# Patient Record
Sex: Male | Born: 1965 | Race: Black or African American | Hispanic: No | Marital: Married | Smoking: Never smoker
Health system: Southern US, Community
[De-identification: ages and names within clinical notes are randomized; demographics above are authoritative.]

## PROBLEM LIST (undated history)

## (undated) DIAGNOSIS — M7552 Bursitis of left shoulder: Secondary | ICD-10-CM

## (undated) DIAGNOSIS — M19019 Primary osteoarthritis, unspecified shoulder: Secondary | ICD-10-CM

## (undated) DIAGNOSIS — Z98811 Dental restoration status: Secondary | ICD-10-CM

## (undated) HISTORY — PX: SHOULDER ARTHROSCOPY W/ ROTATOR CUFF REPAIR: SHX2400

---

## 2015-07-11 ENCOUNTER — Emergency Department
Admission: EM | Admit: 2015-07-11 | Discharge: 2015-07-11 | Disposition: A | Discharge status: Home / Self Care | Payer: BC Managed Care – PPO | Attending: Emergency Medicine | Admitting: Emergency Medicine

## 2015-07-11 ENCOUNTER — Encounter: Payer: Self-pay | Admitting: Emergency Medicine

## 2015-07-11 ENCOUNTER — Emergency Department: Payer: BC Managed Care – PPO

## 2015-07-11 DIAGNOSIS — M545 Low back pain, unspecified: Secondary | ICD-10-CM

## 2015-07-11 MED ORDER — OXYCODONE-ACETAMINOPHEN 5-325 MG PO TABS
1.0000 | ORAL_TABLET | ORAL | Status: DC | PRN
Start: 2015-07-11 — End: 2017-01-27

## 2015-07-11 MED ORDER — KETOROLAC TROMETHAMINE 60 MG/2ML IM SOLN
60.0000 mg | Freq: Once | INTRAMUSCULAR | Status: AC
  Administered 2015-07-11: 60 mg via INTRAMUSCULAR

## 2015-07-11 MED ORDER — LIDOCAINE 5 % EX PTCH
2.0000 | MEDICATED_PATCH | CUTANEOUS | Status: DC
  Administered 2015-07-11: 2 via TRANSDERMAL
  Filled 2015-07-11: qty 2

## 2015-07-11 MED ORDER — KETOROLAC TROMETHAMINE 60 MG/2ML IM SOLN
INTRAMUSCULAR | Status: AC
  Administered 2015-07-11: 60 mg via INTRAMUSCULAR
  Filled 2015-07-11: qty 2

## 2015-07-11 NOTE — ED Notes (Addendum)
Pt reports having back pain since Friday with increased pain since Sunday - The pain is located across the lower part of his back - Pt denies difficulty/pain with urination - Pt denies urinary frequency - Pt denies having any back issues and has never has this type of pain before - Pt hurt back when moving some boxes on Friday and he "slipped"

## 2015-07-11 NOTE — ED Notes (Signed)
Pt ambulatory to triage with c/o sharp lower back pain (7/10),  Worse with sitting or lying down.  Pt reports injury as slipping while moving boxes at home on Saturday, reports taking 2 x 200mg  IBU last night at 2100.  Pt reports pain radiating down both legs.    Pt denies numbness, prior medical hx and no regular meds.

## 2015-07-11 NOTE — ED Notes (Signed)
Called pharmacy to send lidoderm patch

## 2015-07-11 NOTE — ED Provider Notes (Signed)
[facility] Emergency Department Provider Note  ____________________________________________  Time seen: 5:30 AM  I have reviewed the triage vital signs and the nursing notes.   HISTORY  Chief Complaint Back Pain      HPI Justin Ferguson is a 50 y.o. male presents with lower back pain 5 days after accidental slip and fall striking his back while moving. Patient states current pain score 7 out of 10 worse with movement. Patient denies any urinary or bowel changes. Patient denies any lower extremity weakness or numbness. Patient denies any fever     Past medical history None There are no active problems to display for this patient.   Past surgical history None No current outpatient prescriptions on file.  Allergies Bee venom  History reviewed. No pertinent family history.  Social History Social History  Substance Use Topics  . Smoking status: Never Smoker   . Smokeless tobacco: None  . Alcohol Use: Yes     Comment: 3-4 times weekly    Review of Systems  Constitutional: Negative for fever. Eyes: Negative for visual changes. ENT: Negative for sore throat. Cardiovascular: Negative for chest pain. Respiratory: Negative for shortness of breath. Gastrointestinal: Negative for abdominal pain, vomiting and diarrhea. Genitourinary: Negative for dysuria. Musculoskeletal:Positive for back pain. Skin: Negative for rash. Neurological: Negative for headaches, focal weakness or numbness.   10-point ROS otherwise negative.  ____________________________________________   PHYSICAL EXAM:  VITAL SIGNS: ED Triage Vitals  Enc Vitals Group     BP 07/11/15 0459 162/102 mmHg     Pulse Rate 07/11/15 0459 87     Resp 07/11/15 0459 18     Temp 07/11/15 0459 98.2 F (36.8 C)     Temp Source 07/11/15 0459 Oral     SpO2 07/11/15 0459 97 %     Weight 07/11/15 0459 220 lb (99.791 kg)     Height 07/11/15 0459 5\' 11"  (1.803 m)     Head Cir --    Peak Flow --      Pain Score 07/11/15 0500 7     Pain Loc --      Pain Edu? --      Excl. in GC? --      Constitutional: Alert and oriented. Apparent discomfort Gastrointestinal: Soft and nontender. No distention. There is no CVA tenderness. Genitourinary: deferred Musculoskeletal:Diffuse lumbar spine pain with palpation L1-L5. As well as paraspinal muscle tenderness with palpation.  Neurologic:  Normal speech and language. No gross focal neurologic deficits are appreciated. Speech is normal.  Skin:  Skin is warm, dry and intact. No rash noted. Psychiatric: Mood and affect are normal. Speech and behavior are normal. Patient exhibits appropriate insight and judgment.    RADIOLOGY      DG Lumbar Spine Complete (Final result) Result time: 07/11/15 05:59:39   Final result by Rad Results In Interface (07/11/15 05:59:39)   Narrative:   CLINICAL DATA: Acute onset of lower back pain. Slipped while moving boxes. Initial encounter.  EXAM: LUMBAR SPINE - COMPLETE 4+ VIEW  COMPARISON: None.  FINDINGS: There is no evidence of fracture or subluxation. Vertebral bodies demonstrate normal height and alignment. Anterior and lateral osteophytes are seen along the lumbar spine. Intervertebral disc spaces are preserved.  The visualized bowel gas pattern is unremarkable in appearance; air and stool are noted within the colon. The sacroiliac joints are within normal limits.  IMPRESSION: No evidence of fracture or subluxation along the lumbar spine.   Electronically Signed By: Beryle BeamsJeffery Chang M.D.  On: 07/11/2015 05:59      Procedures    INITIAL IMPRESSION / ASSESSMENT AND PLAN / ED COURSE  Pertinent labs & imaging results that were available during my care of the patient were reviewed by me and considered in my medical decision making (see chart for details).  Patient advised to follow-up with orthopedic surgeon if pain is not progressively get better even possibly for  possible herniated disc. Lidoderm patch applied Toradol 60 mg IM given  ____________________________________________   FINAL CLINICAL IMPRESSION(S) / ED DIAGNOSES  Final diagnoses:  Bilateral low back pain without sciatica      Darci Currentandolph N Brown, MD 07/11/15 [phone]

## 2015-07-11 NOTE — Discharge Instructions (Signed)

## 2016-03-05 ENCOUNTER — Emergency Department
Admission: EM | Admit: 2016-03-05 | Discharge: 2016-03-05 | Disposition: A | Discharge status: Home / Self Care | Payer: Self-pay | Attending: Emergency Medicine | Admitting: Emergency Medicine

## 2016-03-05 ENCOUNTER — Emergency Department: Payer: Self-pay

## 2016-03-05 DIAGNOSIS — Y9389 Activity, other specified: Secondary | ICD-10-CM | POA: Insufficient documentation

## 2016-03-05 DIAGNOSIS — Z79899 Other long term (current) drug therapy: Secondary | ICD-10-CM | POA: Insufficient documentation

## 2016-03-05 DIAGNOSIS — Y929 Unspecified place or not applicable: Secondary | ICD-10-CM | POA: Insufficient documentation

## 2016-03-05 DIAGNOSIS — Y999 Unspecified external cause status: Secondary | ICD-10-CM | POA: Insufficient documentation

## 2016-03-05 DIAGNOSIS — X501XXA Overexertion from prolonged static or awkward postures, initial encounter: Secondary | ICD-10-CM | POA: Insufficient documentation

## 2016-03-05 DIAGNOSIS — S93492A Sprain of other ligament of left ankle, initial encounter: Secondary | ICD-10-CM | POA: Insufficient documentation

## 2016-03-05 MED ORDER — TRAMADOL HCL 50 MG PO TABS: 50.0000 mg | ORAL_TABLET | Freq: Four times a day (QID) | ORAL | 0 refills | Status: DC | PRN

## 2016-03-05 MED ORDER — MELOXICAM 15 MG PO TABS: 15.0000 mg | ORAL_TABLET | Freq: Every day | ORAL | 0 refills | Status: AC

## 2016-03-05 NOTE — Discharge Instructions (Signed)
Follow up with the podiatrist if not improving over the week. Rest, ice, and elevate. Return to the ER for symptoms that change or worsen if you are unable to schedule an appointment with podiatry or the primary care provider.

## 2016-03-05 NOTE — ED Provider Notes (Signed)
[facility] Emergency Department Provider Note ____________________________________________  Time seen: Approximately 9:46 AM  I have reviewed the triage vital signs and the nursing notes.   HISTORY  Chief Complaint Ankle Pain and Foot Pain    HPI Justin Ferguson is a 51 y.o. male who presents to the emergency department for evaluation of left ankle pain. He states he stepped off a curb and twisted his left ankle last night. He has taken ibuprofen without relief. His job involves waxing and stripping floors. He was unable to work today.  No past medical history on file.  There are no active problems to display for this patient.   No past surgical history on file.  Prior to Admission medications   Medication Sig Start Date End Date Taking? Authorizing Provider  meloxicam (MOBIC) 15 MG tablet Take 1 tablet (15 mg total) by mouth daily. 03/05/16   Chinita Pesterari B Macala Baldonado, FNP  oxyCODONE-acetaminophen (ROXICET) 5-325 MG tablet Take 1 tablet by mouth every 4 (four) hours as needed for severe pain. 07/11/15   Darci Currentandolph N Brown, MD  traMADol (ULTRAM) 50 MG tablet Take 1 tablet (50 mg total) by mouth every 6 (six) hours as needed. 03/05/16   Chinita Pesterari B Fredric Slabach, FNP    Allergies Bee venom  No family history on file.  Social History Social History  Substance Use Topics  . Smoking status: Never Smoker  . Smokeless tobacco: Never Used  . Alcohol use Yes     Comment: 3-4 times weekly    Review of Systems Constitutional: No recent illness. Cardiovascular: Denies chest pain or palpitations. Respiratory: Denies shortness of breath. Musculoskeletal: Pain in Left ankle. Skin: Negative for rash, wound, lesion. Neurological: Negative for focal weakness or numbness.  ____________________________________________   PHYSICAL EXAM:  VITAL SIGNS: ED Triage Vitals [03/05/16 0910]  Enc Vitals Group     BP (!) 174/105     Pulse Rate 100     Resp 18     Temp 98.8 F (37.1  C)     Temp Source Oral     SpO2 98 %     Weight 235 lb (106.6 kg)     Height 5\' 11"  (1.803 m)     Head Circumference      Peak Flow      Pain Score 8     Pain Loc      Pain Edu?      Excl. in GC?     Constitutional: Alert and oriented. Well appearing and in no acute distress. Eyes: Conjunctivae are normal. EOMI. Head: Atraumatic. Neck: No stridor.  Respiratory: Normal respiratory effort.   Musculoskeletal: ATFL pattern tenderness over the left ankle and foot without acute bony abnormality and mild swelling. Neurologic:  Normal speech and language. No gross focal neurologic deficits are appreciated. Speech is normal. No gait instability. Skin:  Skin is warm, dry and intact. Atraumatic. Psychiatric: Mood and affect are normal. Speech and behavior are normal.  ____________________________________________   LABS (all labs ordered are listed, but only abnormal results are displayed)  Labs Reviewed - No data to display ____________________________________________  RADIOLOGY  Left ankle negative for acute bony abnormality per radiology.  I, Kem Boroughsari Graves Nipp, personally viewed and evaluated these images (plain radiographs) as part of my medical decision making, as well as reviewing the written report by the radiologist. ____________________________________________   PROCEDURES  Procedure(s) performed: Velcro ankle stirrup splint applied by ER tech. Crutches given with crutch training.  ____________________________________________   INITIAL IMPRESSION /  ASSESSMENT AND PLAN / ED COURSE  51 year old male presenting to the emergency department for treatment of ankle sprain.  He was encouraged to rest, ice, and elevate his lower extremity over the next several days. He was instructed to wear the brace and use the crutches as long as he is unable to bear weight. If symptoms persist for longer than one week he is to call and schedule a follow-up appointment with podiatry. He was given  prescriptions for meloxicam and tramadol. He was advised to return to the emergency department for symptoms that change or worsen if he is unable see his primary care provider with or the podiatrist.  Pertinent labs & imaging results that were available during my care of the patient were reviewed by me and considered in my medical decision making (see chart for details).  _________________________________________   FINAL CLINICAL IMPRESSION(S) / ED DIAGNOSES  Final diagnoses:  Sprain of anterior talofibular ligament of left ankle, initial encounter    New Prescriptions   MELOXICAM (MOBIC) 15 MG TABLET    Take 1 tablet (15 mg total) by mouth daily.   TRAMADOL (ULTRAM) 50 MG TABLET    Take 1 tablet (50 mg total) by mouth every 6 (six) hours as needed.    If controlled substance prescribed during this visit, 12 month history viewed on the [facility] prior to issuing an initial prescription for Schedule II or III opiod.    Chinita Pester, FNP 03/05/16 1027    Emily Filbert, MD 03/05/16 [phone]

## 2016-03-05 NOTE — ED Triage Notes (Signed)
[facility] with reports of stepping off a curb yesterday and twisting his ankle  Pt reports 8/10 pain to left ankle/foot at this time

## 2016-03-05 NOTE — ED Notes (Signed)
See triage note  States he stepped down off curb wrong  Having pain with some swelling across foot/ankle  No deformity noted positive pulses

## 2017-01-06 DIAGNOSIS — M7552 Bursitis of left shoulder: Secondary | ICD-10-CM

## 2017-01-06 DIAGNOSIS — M19019 Primary osteoarthritis, unspecified shoulder: Secondary | ICD-10-CM

## 2017-01-06 HISTORY — DX: Primary osteoarthritis, unspecified shoulder: M19.019

## 2017-01-06 HISTORY — DX: Bursitis of left shoulder: M75.52

## 2017-01-21 ENCOUNTER — Other Ambulatory Visit: Payer: Self-pay | Admitting: Orthopedic Surgery

## 2017-01-27 ENCOUNTER — Encounter (HOSPITAL_BASED_OUTPATIENT_CLINIC_OR_DEPARTMENT_OTHER): Payer: Self-pay | Admitting: *Deleted

## 2017-01-27 ENCOUNTER — Other Ambulatory Visit: Payer: Self-pay

## 2017-01-31 NOTE — Anesthesia Preprocedure Evaluation (Signed)
Anesthesia Evaluation  Patient identified by MRN, date of birth, ID band Patient awake    Reviewed: Allergy & Precautions, NPO status , Patient's Chart, lab work & pertinent test results  Airway Mallampati: II  TM Distance: >3 FB Neck ROM: Full    Dental no notable dental hx.    Pulmonary neg pulmonary ROS,    Pulmonary exam normal breath sounds clear to auscultation       Cardiovascular negative cardio ROS Normal cardiovascular exam Rhythm:Regular Rate:Normal     Neuro/Psych negative neurological ROS  negative psych ROS   GI/Hepatic negative GI ROS, Neg liver ROS,   Endo/Other  negative endocrine ROS  Renal/GU negative Renal ROS  negative genitourinary   Musculoskeletal negative musculoskeletal ROS (+)   Abdominal   Peds negative pediatric ROS (+)  Hematology negative hematology ROS (+)   Anesthesia Other Findings   Reproductive/Obstetrics negative OB ROS                             Anesthesia Physical Anesthesia Plan  ASA: II  Anesthesia Plan: General   Post-op Pain Management: GA combined w/ Regional for post-op pain   Induction: Intravenous  PONV Risk Score and Plan: 2 and Ondansetron, Dexamethasone and Treatment may vary due to age or medical condition  Airway Management Planned: Oral ETT  Additional Equipment:   Intra-op Plan:   Post-operative Plan: Extubation in OR  Informed Consent: I have reviewed the patients History and Physical, chart, labs and discussed the procedure including the risks, benefits and alternatives for the proposed anesthesia with the patient or authorized representative who has indicated his/her understanding and acceptance.   Dental advisory given  Plan Discussed with: Anesthesiologist and CRNA  Anesthesia Plan Comments: (  )        Anesthesia Quick Evaluation

## 2017-02-02 ENCOUNTER — Ambulatory Visit (HOSPITAL_BASED_OUTPATIENT_CLINIC_OR_DEPARTMENT_OTHER): Payer: Worker's Compensation | Admitting: Anesthesiology

## 2017-02-02 ENCOUNTER — Encounter (HOSPITAL_BASED_OUTPATIENT_CLINIC_OR_DEPARTMENT_OTHER)
Admission: RE | Disposition: A | Discharge status: Home / Self Care | Payer: Self-pay | Source: Ambulatory Visit | Attending: Orthopedic Surgery

## 2017-02-02 ENCOUNTER — Other Ambulatory Visit: Payer: Self-pay

## 2017-02-02 ENCOUNTER — Ambulatory Visit (HOSPITAL_BASED_OUTPATIENT_CLINIC_OR_DEPARTMENT_OTHER)
Admission: RE | Admit: 2017-02-02 | Discharge: 2017-02-02 | Disposition: A | Discharge status: Home / Self Care | Payer: Worker's Compensation | Source: Ambulatory Visit | Attending: Orthopedic Surgery | Admitting: Orthopedic Surgery

## 2017-02-02 ENCOUNTER — Encounter (HOSPITAL_BASED_OUTPATIENT_CLINIC_OR_DEPARTMENT_OTHER): Payer: Self-pay

## 2017-02-02 DIAGNOSIS — M19012 Primary osteoarthritis, left shoulder: Secondary | ICD-10-CM | POA: Diagnosis not present

## 2017-02-02 DIAGNOSIS — M7552 Bursitis of left shoulder: Secondary | ICD-10-CM | POA: Diagnosis not present

## 2017-02-02 HISTORY — DX: Dental restoration status: Z98.811

## 2017-02-02 HISTORY — DX: Primary osteoarthritis, unspecified shoulder: M19.019

## 2017-02-02 HISTORY — DX: Bursitis of left shoulder: M75.52

## 2017-02-02 SURGERY — SHOULDER ARTHROSCOPY WITH SUBACROMIAL DECOMPRESSION AND DISTAL CLAVICLE EXCISION
Anesthesia: General | Site: Shoulder | Laterality: Left

## 2017-02-02 MED ORDER — MIDAZOLAM HCL 2 MG/2ML IJ SOLN
1.0000 mg | INTRAMUSCULAR | Status: DC | PRN
  Administered 2017-02-02: 2 mg via INTRAVENOUS

## 2017-02-02 MED ORDER — EPHEDRINE SULFATE-NACL 50-0.9 MG/10ML-% IV SOSY
PREFILLED_SYRINGE | INTRAVENOUS | Status: DC | PRN
  Administered 2017-02-02 (×5): 10 mg via INTRAVENOUS

## 2017-02-02 MED ORDER — ROCURONIUM BROMIDE 50 MG/5ML IV SOSY
PREFILLED_SYRINGE | INTRAVENOUS | Status: DC | PRN
  Administered 2017-02-02: 50 mg via INTRAVENOUS

## 2017-02-02 MED ORDER — CEFAZOLIN SODIUM-DEXTROSE 2-4 GM/100ML-% IV SOLN
2.0000 g | INTRAVENOUS | Status: AC
  Administered 2017-02-02: 2 g via INTRAVENOUS

## 2017-02-02 MED ORDER — PHENYLEPHRINE 40 MCG/ML (10ML) SYRINGE FOR IV PUSH (FOR BLOOD PRESSURE SUPPORT)
PREFILLED_SYRINGE | INTRAVENOUS | Status: DC | PRN
  Administered 2017-02-02 (×3): 80 ug via INTRAVENOUS
  Administered 2017-02-02: 160 ug via INTRAVENOUS
  Administered 2017-02-02 (×2): 80 ug via INTRAVENOUS

## 2017-02-02 MED ORDER — EPHEDRINE 5 MG/ML INJ
INTRAVENOUS | Status: AC
  Filled 2017-02-02: qty 10

## 2017-02-02 MED ORDER — ONDANSETRON HCL 4 MG/2ML IJ SOLN: 4.0000 mg | Freq: Once | INTRAMUSCULAR | Status: DC | PRN

## 2017-02-02 MED ORDER — MIDAZOLAM HCL 2 MG/2ML IJ SOLN
INTRAMUSCULAR | Status: AC
  Filled 2017-02-02: qty 2

## 2017-02-02 MED ORDER — ONDANSETRON HCL 4 MG/2ML IJ SOLN
INTRAMUSCULAR | Status: DC | PRN
  Administered 2017-02-02: 4 mg via INTRAVENOUS

## 2017-02-02 MED ORDER — ACETAMINOPHEN 160 MG/5ML PO SOLN: 325.0000 mg | ORAL | Status: DC | PRN

## 2017-02-02 MED ORDER — PROPOFOL 10 MG/ML IV BOLUS
INTRAVENOUS | Status: DC | PRN
  Administered 2017-02-02: 200 mg via INTRAVENOUS

## 2017-02-02 MED ORDER — DEXAMETHASONE SODIUM PHOSPHATE 10 MG/ML IJ SOLN
INTRAMUSCULAR | Status: AC
  Filled 2017-02-02: qty 1

## 2017-02-02 MED ORDER — ROCURONIUM BROMIDE 10 MG/ML (PF) SYRINGE
PREFILLED_SYRINGE | INTRAVENOUS | Status: AC
  Filled 2017-02-02: qty 5

## 2017-02-02 MED ORDER — BUPIVACAINE-EPINEPHRINE (PF) 0.5% -1:200000 IJ SOLN
INTRAMUSCULAR | Status: AC
  Filled 2017-02-02: qty 30

## 2017-02-02 MED ORDER — PHENYLEPHRINE 40 MCG/ML (10ML) SYRINGE FOR IV PUSH (FOR BLOOD PRESSURE SUPPORT)
PREFILLED_SYRINGE | INTRAVENOUS | Status: AC
  Filled 2017-02-02: qty 10

## 2017-02-02 MED ORDER — DEXAMETHASONE SODIUM PHOSPHATE 10 MG/ML IJ SOLN
INTRAMUSCULAR | Status: DC | PRN
  Administered 2017-02-02: 10 mg via INTRAVENOUS

## 2017-02-02 MED ORDER — FENTANYL CITRATE (PF) 100 MCG/2ML IJ SOLN
INTRAMUSCULAR | Status: AC
  Filled 2017-02-02: qty 2

## 2017-02-02 MED ORDER — ACETAMINOPHEN 325 MG PO TABS: 325.0000 mg | ORAL_TABLET | ORAL | Status: DC | PRN

## 2017-02-02 MED ORDER — SUGAMMADEX SODIUM 200 MG/2ML IV SOLN
INTRAVENOUS | Status: AC
  Filled 2017-02-02: qty 2

## 2017-02-02 MED ORDER — BUPIVACAINE HCL (PF) 0.75 % IJ SOLN
INTRAMUSCULAR | Status: DC | PRN
  Administered 2017-02-02: 30 mL

## 2017-02-02 MED ORDER — ACETAMINOPHEN 325 MG PO TABS: 650.0000 mg | ORAL_TABLET | Freq: Four times a day (QID) | ORAL | Status: AC

## 2017-02-02 MED ORDER — OXYCODONE HCL 5 MG/5ML PO SOLN: 5.0000 mg | Freq: Once | ORAL | Status: DC | PRN

## 2017-02-02 MED ORDER — KETOROLAC TROMETHAMINE 30 MG/ML IJ SOLN: 30.0000 mg | Freq: Once | INTRAMUSCULAR | Status: DC | PRN

## 2017-02-02 MED ORDER — PROPOFOL 10 MG/ML IV BOLUS
INTRAVENOUS | Status: AC
Start: 2017-02-02 — End: ?
  Filled 2017-02-02: qty 40

## 2017-02-02 MED ORDER — FENTANYL CITRATE (PF) 100 MCG/2ML IJ SOLN: 25.0000 ug | INTRAMUSCULAR | Status: DC | PRN

## 2017-02-02 MED ORDER — FENTANYL CITRATE (PF) 100 MCG/2ML IJ SOLN
50.0000 ug | INTRAMUSCULAR | Status: DC | PRN
  Administered 2017-02-02: 100 ug via INTRAVENOUS

## 2017-02-02 MED ORDER — SCOPOLAMINE 1 MG/3DAYS TD PT72: 1.0000 | MEDICATED_PATCH | Freq: Once | TRANSDERMAL | Status: DC | PRN

## 2017-02-02 MED ORDER — CEFAZOLIN SODIUM-DEXTROSE 2-4 GM/100ML-% IV SOLN
INTRAVENOUS | Status: AC
  Filled 2017-02-02: qty 100

## 2017-02-02 MED ORDER — PHENYLEPHRINE 40 MCG/ML (10ML) SYRINGE FOR IV PUSH (FOR BLOOD PRESSURE SUPPORT)
PREFILLED_SYRINGE | INTRAVENOUS | Status: AC
Start: 2017-02-02 — End: ?
  Filled 2017-02-02: qty 10

## 2017-02-02 MED ORDER — SUGAMMADEX SODIUM 200 MG/2ML IV SOLN
INTRAVENOUS | Status: DC | PRN
  Administered 2017-02-02: 200 mg via INTRAVENOUS

## 2017-02-02 MED ORDER — MEPERIDINE HCL 25 MG/ML IJ SOLN: 6.2500 mg | INTRAMUSCULAR | Status: DC | PRN

## 2017-02-02 MED ORDER — BUPIVACAINE HCL (PF) 0.25 % IJ SOLN
INTRAMUSCULAR | Status: AC
  Filled 2017-02-02: qty 30

## 2017-02-02 MED ORDER — LIDOCAINE 2% (20 MG/ML) 5 ML SYRINGE
INTRAMUSCULAR | Status: AC
  Filled 2017-02-02: qty 5

## 2017-02-02 MED ORDER — OXYCODONE HCL 5 MG PO TABS: 5.0000 mg | ORAL_TABLET | Freq: Four times a day (QID) | ORAL | 0 refills | Status: DC | PRN

## 2017-02-02 MED ORDER — LACTATED RINGERS IV SOLN
INTRAVENOUS | Status: DC
  Administered 2017-02-02 (×2): via INTRAVENOUS

## 2017-02-02 MED ORDER — ONDANSETRON HCL 4 MG/2ML IJ SOLN
INTRAMUSCULAR | Status: AC
  Filled 2017-02-02: qty 2

## 2017-02-02 MED ORDER — LACTATED RINGERS IV SOLN
INTRAVENOUS | Status: DC
  Administered 2017-02-02: 08:00:00 via INTRAVENOUS

## 2017-02-02 MED ORDER — OXYCODONE HCL 5 MG PO TABS: 5.0000 mg | ORAL_TABLET | Freq: Once | ORAL | Status: DC | PRN

## 2017-02-02 MED ORDER — LIDOCAINE 2% (20 MG/ML) 5 ML SYRINGE
INTRAMUSCULAR | Status: DC | PRN
  Administered 2017-02-02: 80 mg via INTRAVENOUS

## 2017-02-02 MED ORDER — FENTANYL CITRATE (PF) 100 MCG/2ML IJ SOLN
INTRAMUSCULAR | Status: DC | PRN
  Administered 2017-02-02: 100 ug via INTRAVENOUS

## 2017-02-02 SURGICAL SUPPLY — 80 items
BENZOIN TINCTURE PRP APPL 2/3 (GAUZE/BANDAGES/DRESSINGS) IMPLANT
BLADE AVERAGE 25MMX9MM (BLADE)
BLADE AVERAGE 25X9 (BLADE) IMPLANT
BLADE CUTTER GATOR 3.5 (BLADE) IMPLANT
BLADE GREAT WHITE 4.2 (BLADE) ×2 IMPLANT
BLADE GREAT WHITE 4.2MM (BLADE) ×1
BLADE SURG 15 STRL LF DISP TIS (BLADE) IMPLANT
BLADE SURG 15 STRL SS (BLADE)
BNDG GAUZE ELAST 4 BULKY (GAUZE/BANDAGES/DRESSINGS) IMPLANT
BUR OVAL 6.0 (BURR) ×3 IMPLANT
CANNULA 5.75X71 LONG (CANNULA) IMPLANT
CANNULA TWIST IN 8.25X7CM (CANNULA) ×3 IMPLANT
CLEANER CAUTERY TIP 5X5 PAD (MISCELLANEOUS) IMPLANT
CLOSURE WOUND 1/2 X4 (GAUZE/BANDAGES/DRESSINGS)
DECANTER SPIKE VIAL GLASS SM (MISCELLANEOUS) IMPLANT
DERMABOND ADVANCED (GAUZE/BANDAGES/DRESSINGS)
DERMABOND ADVANCED .7 DNX12 (GAUZE/BANDAGES/DRESSINGS) IMPLANT
DRAPE IMP U-DRAPE 54X76 (DRAPES) ×3 IMPLANT
DRAPE STERI 35X30 U-POUCH (DRAPES) ×3 IMPLANT
DRAPE SURG 17X23 STRL (DRAPES) ×3 IMPLANT
DRAPE U-SHAPE 47X51 STRL (DRAPES) ×3 IMPLANT
DRAPE U-SHAPE 76X120 STRL (DRAPES) ×6 IMPLANT
DRSG EMULSION OIL 3X3 NADH (GAUZE/BANDAGES/DRESSINGS) IMPLANT
DURAPREP 26ML APPLICATOR (WOUND CARE) ×3 IMPLANT
ELECT REM PT RETURN 9FT ADLT (ELECTROSURGICAL) ×3
ELECTRODE REM PT RTRN 9FT ADLT (ELECTROSURGICAL) ×1 IMPLANT
GAUZE SPONGE 4X4 12PLY STRL LF (GAUZE/BANDAGES/DRESSINGS) ×3 IMPLANT
GLOVE BIO SURGEON STRL SZ7.5 (GLOVE) ×3 IMPLANT
GLOVE BIOGEL PI IND STRL 7.0 (GLOVE) ×2 IMPLANT
GLOVE BIOGEL PI IND STRL 8 (GLOVE) ×1 IMPLANT
GLOVE BIOGEL PI INDICATOR 7.0 (GLOVE) ×4
GLOVE BIOGEL PI INDICATOR 8 (GLOVE) ×2
GLOVE ECLIPSE 6.5 STRL STRAW (GLOVE) ×6 IMPLANT
GOWN STRL REUS W/ TWL LRG LVL3 (GOWN DISPOSABLE) ×2 IMPLANT
GOWN STRL REUS W/TWL LRG LVL3 (GOWN DISPOSABLE) ×4
GOWN STRL REUS W/TWL XL LVL3 (GOWN DISPOSABLE) ×3 IMPLANT
IV NS IRRIG 3000ML ARTHROMATIC (IV SOLUTION) ×6 IMPLANT
MANIFOLD NEPTUNE II (INSTRUMENTS) ×3 IMPLANT
NDL SUT 6 .5 CRC .975X.05 MAYO (NEEDLE) IMPLANT
NEEDLE MAYO TAPER (NEEDLE)
NEEDLE SCORPION MULTI FIRE (NEEDLE) IMPLANT
PACK ARTHROSCOPY DSU (CUSTOM PROCEDURE TRAY) ×3 IMPLANT
PACK BASIN DAY SURGERY FS (CUSTOM PROCEDURE TRAY) ×3 IMPLANT
PAD CLEANER CAUTERY TIP 5X5 (MISCELLANEOUS)
PAD ORTHO SHOULDER 7X19 LRG (SOFTGOODS) IMPLANT
PENCIL BUTTON HOLSTER BLD 10FT (ELECTRODE) IMPLANT
PROBE BIPOLAR ATHRO 135MM 90D (MISCELLANEOUS) ×3 IMPLANT
RESTRAINT HEAD UNIVERSAL NS (MISCELLANEOUS) ×3 IMPLANT
RETRIEVER SUT HEWSON (MISCELLANEOUS) IMPLANT
SLING ARM FOAM STRAP LRG (SOFTGOODS) IMPLANT
SLING ARM MED ADULT FOAM STRAP (SOFTGOODS) IMPLANT
SLING ARM SM FOAM STRAP (SOFTGOODS) IMPLANT
SLING ARM XL FOAM STRAP (SOFTGOODS) ×3 IMPLANT
SLING ULTRA III MED (ORTHOPEDIC SUPPLIES) IMPLANT
SPONGE LAP 4X18 X RAY DECT (DISPOSABLE) IMPLANT
STAPLER VISISTAT 35W (STAPLE) ×3 IMPLANT
STRIP CLOSURE SKIN 1/2X4 (GAUZE/BANDAGES/DRESSINGS) IMPLANT
SUCTION FRAZIER HANDLE 10FR (MISCELLANEOUS)
SUCTION TUBE FRAZIER 10FR DISP (MISCELLANEOUS) IMPLANT
SUPPORT WRAP ARM LG (MISCELLANEOUS) ×3 IMPLANT
SUT ETHIBOND 2 OS 4 DA (SUTURE) IMPLANT
SUT ETHILON 3 0 PS 1 (SUTURE) IMPLANT
SUT FIBERWIRE #2 38 T-5 BLUE (SUTURE)
SUT TIGER TAPE 7 IN WHITE (SUTURE) IMPLANT
SUT VIC AB 0 SH 27 (SUTURE) IMPLANT
SUT VIC AB 2-0 CT3 27 (SUTURE) IMPLANT
SUT VIC AB 2-0 SH 27 (SUTURE)
SUT VIC AB 2-0 SH 27XBRD (SUTURE) IMPLANT
SUT VICRYL 4-0 PS2 18IN ABS (SUTURE) IMPLANT
SUT VICRYL RAPIDE 4-0 (SUTURE) IMPLANT
SUT VICRYL RAPIDE 4/0 PS 2 (SUTURE) IMPLANT
SUTURE FIBERWR #2 38 T-5 BLUE (SUTURE) IMPLANT
SYR BULB 3OZ (MISCELLANEOUS) IMPLANT
TAPE FIBER 2MM 7IN #2 BLUE (SUTURE) IMPLANT
TOWEL OR 17X24 6PK STRL BLUE (TOWEL DISPOSABLE) ×3 IMPLANT
TOWEL OR NON WOVEN STRL DISP B (DISPOSABLE) ×3 IMPLANT
TUBE CONNECTING 20'X1/4 (TUBING) ×1
TUBE CONNECTING 20X1/4 (TUBING) ×2 IMPLANT
TUBING ARTHRO INFLOW-ONLY STRL (TUBING) ×3 IMPLANT
YANKAUER SUCT BULB TIP NO VENT (SUCTIONS) IMPLANT

## 2017-02-02 NOTE — Anesthesia Procedure Notes (Signed)
Procedure Name: Intubation Date/Time: 02/02/2017 8:43 AM Performed by: Genelle Bal, CRNA Pre-anesthesia Checklist: Patient identified, Emergency Drugs available, Suction available and Patient being monitored Patient Re-evaluated:Patient Re-evaluated prior to induction Oxygen Delivery Method: Circle system utilized Preoxygenation: Pre-oxygenation with 100% oxygen Induction Type: IV induction Ventilation: Mask ventilation without difficulty, Two handed mask ventilation required and Oral airway inserted - appropriate to patient size Laryngoscope Size: Mac and 4 Grade View: Grade II Tube type: Oral Tube size: 8.0 mm Number of attempts: 2 Airway Equipment and Method: Stylet and Oral airway Placement Confirmation: ETT inserted through vocal cords under direct vision,  positive ETCO2 and breath sounds checked- equal and bilateral Secured at: 23 cm Tube secured with: Tape Dental Injury: Teeth and Oropharynx as per pre-operative assessment  Difficulty Due To: Difficult Airway- due to anterior larynx Future Recommendations: Recommend- induction with short-acting agent, and alternative techniques readily available

## 2017-02-02 NOTE — Progress Notes (Signed)
Dr. Miguel Rotadonno by to see patient, discussed possible block to splenic nerve with subsequent shallow respiratory rate. Okay to discharge home with continued incentive spirometry use and careful administration of narcotics. Encouraged pt to rest upright today.

## 2017-02-02 NOTE — Discharge Instructions (Signed)
Discharge Instructions   You have a light dressing on your shoulder.  You may begin gentle motion of your fingers and hand immediately, but you should not do any heavy lifting or gripping.  Elevate your hand to reduce pain & swelling of the digits.  Ice over the operative site may be helpful to reduce pain & swelling.  DO NOT USE HEAT. Pain medicine has been prescribed for you.  Take Mobic daily as prescribed. Take Tylenol 650 mg every 6 hours. Take Oxycodone for severe pain as a rescue medicine. Leave the dressing in place until the third day after your surgery and then remove it, leaving it open to air.  After the bandage has been removed you may shower, regularly washing the incision and letting the water run over it, but not submerging it (no swimming, soaking it in dishwater, etc.) You may drive a car when you are off of prescription pain medications and can safely control your vehicle with both hands. We will address therapy when you return to the office. You may have already made your follow-up appointment when we completed your preop visit.  If not, please call our office today or the next business day to make your return appointment for 10-15 days after surgery.   Please call [phone] during normal business hours or [phone] after hours for any problems. Including the following:  - excessive redness of the incisions - drainage for more than 4 days - fever of more than 101.5 F  *Please note that pain medications will not be refilled after hours or on weekends.  WORK STATUS: This patient will be out of work until he returns to clinic for his first post operative evaluation.      Regional Anesthesia Blocks  1. Numbness or the inability to move the "blocked" extremity may last from 3-48 hours after placement. The length of time depends on the medication injected and your individual response to the medication. If the numbness is not going away after 48 hours, call your  surgeon.  2. The extremity that is blocked will need to be protected until the numbness is gone and the  Strength has returned. Because you cannot feel it, you will need to take extra care to avoid injury. Because it may be weak, you may have difficulty moving it or using it. You may not know what position it is in without looking at it while the block is in effect.  3. For blocks in the legs and feet, returning to weight bearing and walking needs to be done carefully. You will need to wait until the numbness is entirely gone and the strength has returned. You should be able to move your leg and foot normally before you try and bear weight or walk. You will need someone to be with you when you first try to ensure you do not fall and possibly risk injury.  4. Bruising and tenderness at the needle site are common side effects and will resolve in a few days.  5. Persistent numbness or new problems with movement should be communicated to the surgeon or the [facility] [phone] [facility] [phone]).       Post Anesthesia Home Care Instructions  Activity: Get plenty of rest for the remainder of the day. A responsible individual must stay with you for 24 hours following the procedure.  For the next 24 hours, DO NOT: -Drive a car -Advertising copywriter -Drink alcoholic beverages -Take any medication unless instructed  by your physician -Make any legal decisions or sign important papers.  Meals: Start with liquid foods such as gelatin or soup. Progress to regular foods as tolerated. Avoid greasy, spicy, heavy foods. If nausea and/or vomiting occur, drink only clear liquids until the nausea and/or vomiting subsides. Call your physician if vomiting continues.  Special Instructions/Symptoms: Your throat may feel dry or sore from the anesthesia or the breathing tube placed in your throat during surgery. If this causes discomfort, gargle with warm salt water. The  discomfort should disappear within 24 hours.  If you had a scopolamine patch placed behind your ear for the management of post- operative nausea and/or vomiting:  1. The medication in the patch is effective for 72 hours, after which it should be removed.  Wrap patch in a tissue and discard in the trash. Wash hands thoroughly with soap and water. 2. You may remove the patch earlier than 72 hours if you experience unpleasant side effects which may include dry mouth, dizziness or visual disturbances. 3. Avoid touching the patch. Wash your hands with soap and water after contact with the patch.

## 2017-02-02 NOTE — Anesthesia Postprocedure Evaluation (Signed)
Anesthesia Post Note  Patient: Justin Ferguson  Procedure(s) Performed: LEFT SHOULDER DISTAL CLAVICLE  EXCISION AND SUBACROMIAL DECOMPRESSION/ ACROMIOPLASTY (Left Shoulder)     Patient location during evaluation: PACU Anesthesia Type: General Level of consciousness: awake and alert Pain management: pain level controlled Vital Signs Assessment: post-procedure vital signs reviewed and stable Respiratory status: spontaneous breathing, nonlabored ventilation, respiratory function stable and patient connected to nasal cannula oxygen Cardiovascular status: blood pressure returned to baseline and stable Postop Assessment: no apparent nausea or vomiting Anesthetic complications: no    Last Vitals:  Vitals:   02/02/17 1045 02/02/17 1100  BP:  (!) 146/88  Pulse: (!) 105 (!) 103  Resp: 19 (!) 23  Temp:    SpO2: 94% 93%    Last Pain:  Vitals:   02/02/17 1043  TempSrc:   PainSc: 0-No pain                 Abdikadir Fohl

## 2017-02-02 NOTE — Progress Notes (Signed)
Pt up to phase II, sats in mid to high 80's, instructed on incentive spirometry sats up to 94 when using. Will continue to monitor and encourgae po's

## 2017-02-02 NOTE — Progress Notes (Signed)
Assisted Dr. Oddono with left, ultrasound guided, supraclavicular block. Side rails up, monitors on throughout procedure. See vital signs in flow sheet. Tolerated Procedure well. 

## 2017-02-02 NOTE — Anesthesia Procedure Notes (Signed)
Anesthesia Regional Block: Interscalene brachial plexus block   Pre-Anesthetic Checklist: ,, timeout performed, Correct Patient, Correct Site, Correct Laterality, Correct Procedure, Correct Position, site marked, Risks and benefits discussed,  Surgical consent,  Pre-op evaluation,  At surgeon's request and post-op pain management  Laterality: Left  Prep: chloraprep       Needles:  Injection technique: Single-shot  Needle Type: Echogenic Stimulator Needle     Needle Length: 5cm  Needle Gauge: 22     Additional Needles:   Procedures:, nerve stimulator,,, ultrasound used (permanent image in chart),,,,  Narrative:  Start time: 02/02/2017 8:11 AM End time: 02/02/2017 8:18 AM Injection made incrementally with aspirations every 5 mL.  Performed by: Personally  Anesthesiologist: Bethena Midgetddono, Cyril Railey, MD  Additional Notes: Functioning IV was confirmed and monitors were applied.  A 50mm 22ga Arrow echogenic stimulator needle was used. Sterile prep and drape,hand hygiene and sterile gloves were used. Ultrasound guidance: relevant anatomy identified, needle position confirmed, local anesthetic spread visualized around nerve(s)., vascular puncture avoided.  Image printed for medical record. Negative aspiration and negative test dose prior to incremental administration of local anesthetic. The patient tolerated the procedure well.

## 2017-02-02 NOTE — H&P (Addendum)
Justin Ferguson is an 52 y.o. male.   CC / Reason for Visit: Left shoulder pain HPI: This patient returns reevaluation, indicating that he continues to have symptoms of pain in his left shoulder, worsened with certain activities that require strength away from his side, and also with crossed chest adduction type activities.  The pain continues to disrupt is sleeping still.  He reports that following the [facility] joint injection, during the lidocaine phase, his pain was much improved..  The benefit from the steroid has been less robust, with his pain now increasing and more regularly present, much like before the injection.  He is taking no medications by mouth regularly now.  HPI 12-01-16: This patient returns to clinic and indicates that this is his first full week of full duty work.  He states that he still has some pain only when he moves his arm actively above 90.  It is usually directly over the top of his [facility] joint.    HPI 10/29/2016:This patient returns to clinic today and indicates that he continues with some therapy.  He has 6 more visits approved and currently they are working on some strengthening as well as iontophoresis.  He does need a refill of meloxicam.  Patient also reports that he still is not back to work in any capacity.  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  He also indicates that he significantly better but still has some pain with shoulder abduction and empty can.    HPI 09/29/2016:This patient returns to clinic today for reevaluation of his left shoulder pain.  He indicates that the subarticular injection that he had on 08/28/2016 was helpful and that he had some relief.  He indicates that he continues to have therapy as well as dry needling.  He reports that he also continues to utilize Mobic.   HPI 08/28/2016:This patient returns to clinic today after having had his MRI to discuss results and treatment options.  The  patient is still not working and continues to have left shoulder pain.  He indicates that he may be able to take meloxicam now is his blood pressure does not seem to be as high.  He reports that he still taking extra strength Tylenol and that he attempts range of motion while at home.    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  He was on one end of something that shifted, causing the onset of searing pain in the left shoulder.  He had to sit the piece down.  He continued to work but had increasing pain.  He reports that he was evaluated at employee health, and was eventually taken out of work.  He has tried ibuprofen, but it was thought that it was perhaps contributing to hypertension and so it was discontinued.  He has taken Tylenol Extra Strength and tramadol as well.  He remains presently out of work.  Past Medical History:  Diagnosis Date  . 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 Dental crown present   . Subacromial bursitis of left shoulder joint 01/2017    Past Surgical History:  Procedure Laterality Date  . SHOULDER ARTHROSCOPY W/ ROTATOR CUFF REPAIR Right     History reviewed. No pertinent family history. Social History:  reports  that  has never smoked. he has never used smokeless tobacco. He reports that he drinks alcohol. He reports that he does not use drugs.  Allergies:  Allergies  Allergen Reactions  . Bee Venom Shortness Of Breath and Swelling    Medications Prior to Admission  Medication Sig Dispense Refill  . meloxicam (MOBIC) 15 MG tablet Take 1 tablet (15 mg total) by mouth daily. 30 tablet 0    No results found for this or any previous visit (from the past 48 hour(s)). No results found.  Review of Systems  All other systems reviewed and are negative.   Height 5\' 11"  (1.803 m), weight 112.5 kg (248  lb). Physical Exam  Constitutional:  WD, WN, NAD HEENT:  NCAT, EOMI Neuro/Psych:  Alert & oriented to person, place, and time; appropriate mood & affect Lymphatic: No generalized UE edema or lymphadenopathy Extremities / MSK:  Both UE are normal with respect to appearance, ranges of motion, joint stability, muscle strength/tone, sensation, & perfusion except as otherwise noted:  The left shoulder musculature has not atrophied.  No visible external abnormalities.  Evaluation is much the same with full active range of motion in all planes, but pain with direct pressure over her AC joint and forced internal rotation and adduction.  Some pain exquisitely also over the [facility].  There is also some pain to a lesser degree with empty can testing and impingement maneuvers.  No strength deficits.  Labs / Xrays:  No radiographic studies obtained today.  An MRI was performed on 08/20/2016 that demonstrated rotator cuff tendinosis as well as AC joint arthrosis and impingement.  No frank complete rotator cuff tear.  Please see the report for further information  Assessment: Left shoulder pain and impingement with AC joint arthrosis  Plan:  I discussed these findings with him.  At this point, he has exhausted all nonoperative means of treatment, and his symptoms remain 5 months following injury.  He would appear that his [facility] to [facility] indicates that a good bit of his pain is coming from the [facility], but he also likely has some pain from the subacromial space and perhaps even the rotator cuff itself.  I discussed the details of possible operative treatment with him, including arthroscopic evaluation of the shoulder and it structures, debridement of structures as indicated which would likely include subacromial decompression and acromioplasty, and also an arthroscopic distal clavicle excision.  I suspect that the evaluation will confirm an intact rotator cuff, although there may be some  partial undersurface fraying or tearing that could be debrided and help with regard to pain as well.  He would like to proceed with this.  We will seek Worker's Compensation authorization and schedule once authorization is obtained.  The details of the operative procedure were discussed with the patient.  Questions were invited and answered.  In addition to the goal of the procedure, the risks of the procedure to include but not limited to bleeding; infection; damage to the nerves or blood vessels that could result in bleeding, numbness, weakness, chronic pain, and the need for additional procedures; stiffness; the need for revision surgery; and anesthetic risks were reviewed.  No specific outcome was guaranteed or implied.  Informed consent was obtained.  Jodi Marbleavid A Oryn Casanova, MD 02/02/2017, 7:12 AM

## 2017-02-02 NOTE — Transfer of Care (Signed)
Immediate Anesthesia Transfer of Care Note  Patient: Suezanne CheshireMarvin R Ploeger  Procedure(s) Performed: LEFT SHOULDER DISTAL CLAVICLE  EXCISION AND SUBACROMIAL DECOMPRESSION/ ACROMIOPLASTY (Left Shoulder)  Patient Location: PACU  Anesthesia Type:GA combined with regional for post-op pain  Level of Consciousness: awake, alert  and oriented  Airway & Oxygen Therapy: Patient Spontanous Breathing and Patient connected to face mask oxygen  Post-op Assessment: Report given to RN and Post -op Vital signs reviewed and stable  Post vital signs: Reviewed and stable  Last Vitals:  Vitals:   02/02/17 0815 02/02/17 0820  BP: (!) 157/91   Pulse: 77 80  Resp: 10 10  Temp:    SpO2: 99% 99%    Last Pain:  Vitals:   02/02/17 0728  TempSrc: Oral  PainSc: 2          Complications: No apparent anesthesia complications

## 2017-02-02 NOTE — Interval H&P Note (Signed)
History and Physical Interval Note:  02/02/2017 7:14 AM  Justin Ferguson  has presented today for surgery, with the diagnosis of LEFT SHOULDER AC JOINT ARTHRITIS AND SUBACROMIAL BURSITIS M19.012, M75.42  The various methods of treatment have been discussed with the patient and family. After consideration of risks, benefits and other options for treatment, the patient has consented to  Procedure(s): LEFT SHOULDER DISTAL CLAVICLE  EXCISION AND SUBACROMIAL DECOMPRESSION/ ACROMIOPLASTY (Left) as a surgical intervention .  The patient's history has been reviewed, patient examined, no change in status, stable for surgery.  I have reviewed the patient's chart and labs.  Questions were answered to the patient's satisfaction.     Jodi Marbleavid A Auriella Wieand

## 2017-02-02 NOTE — Op Note (Signed)
02/02/2017  7:13 AM  PATIENT:  Justin Ferguson  52 y.o. male  PRE-OPERATIVE DIAGNOSIS: Left shoulder pain with subacromial bursitis and ACJ OA  POST-OPERATIVE DIAGNOSIS:  Same, with minimal undersurface SS fraying  PROCEDURE:  Left shoulder arthroscopy, with subacromial decompression/acromioplasty, distal clavicle excision, and undersurface RTC debridement  SURGEON: Cliffton Astersavid A. Janee Mornhompson, MD  PHYSICIAN ASSISTANT: Danielle RankinKirsten Schrader, OPA-C  ANESTHESIA:  regional and general  SPECIMENS:  None  DRAINS:   None  EBL:  less than 50 mL  PREOPERATIVE INDICATIONS:  Justin CheshireMarvin R Ferguson is a  52 y.o. male with history of left shoulder pain that failed to resolve with nonoperative management.  X-rays, clinical exam, and MRI scan revealed likely pain of AC joint origin, but also with some subacromial bursitis/impingement pain.  The risks benefits and alternatives were discussed with the patient preoperatively including but not limited to the risks of infection, bleeding, nerve injury, cardiopulmonary complications, the need for revision surgery, among others, and the patient verbalized understanding and consented to proceed.  OPERATIVE IMPLANTS: none  OPERATIVE PROCEDURE:  After receiving prophylactic antibiotics and a regional block, the patient was escorted to the operative theatre and placed in a supine position.  General anesthesia was administered.  A surgical "time-out" was performed during which the planned procedure, proposed operative site, and the correct patient identity were compared to the operative consent and agreement confirmed by the circulating nurse according to current facility policy.  The patient was repositioned in a beachchair position care to pad the appropriate pressure points.  The left upper extremity was then prepped with DuraPrep and draped in usual sterile fashion.  External landmarks were drawn.  Standard posterior viewing portal was established first.  The chondral surfaces  of the glenohumeral joint were in good condition.  The biceps tendon was intact without significant synovitis.  There was however some dorsal synovitis on the supraspinatus, and the portion underlying the [facility] joint.  An anterior portal was established after the localization and this allowed introduction of anterior instruments.  The rotator cuff was intact, with some minimal undersurface fraying.  Synovitis was debrided with a combination of ArthroCare wand and suction shaver.  The fraying at the cuff insertion was debrided similarly.  The labrum was intact and unremarkable.  Attention was shifted to the subacromial space, where a lateral portal was also established for introduction of instruments.  There was a copious amount of inflamed bursal tissue, which was painstakingly excised with a suction shaver and ArthroCare wand.  Once the undersurface of the acromion was established, it was found to be some spurring there as well.  There was dense tissue all the way back to the [facility] joint and medial to this that was excised to alleviate the impingement.  Working through the 3 portals, the bur was introduced to perform a anterolateral acromioplasty, followed by excision of the distal clavicle.  A superior portal was also established to help with this.  In this manner, a good 6 mm of resection space was present, somewhere between 6 and a centimeter.  This was all the way from front to back along the [facility] joint.  The distal undersurface of the clavicle was also planed off somewhat with the bur to help alleviate inferior impingement at this point.  Final pictures were obtained and the instruments removed.  The skin was closed with staples and a dressing was applied.  His left upper extremity was placed into a sling.  He was taken to the recovery room in  stable condition, breathing spontaneously.  DISPOSITION: He will be discharged home today with typical instructions, returning in 10-15 days.  He will remain out of work until  at least his first postop visit.

## 2017-07-21 IMAGING — CR DG LUMBAR SPINE COMPLETE 4+V
1 series · 5 of 5 positions shown · non-contrast
Comparison: None.

CLINICAL DATA: Acute onset of lower back pain. Slipped while moving
boxes. Initial encounter.

EXAM:
LUMBAR SPINE - COMPLETE 4+ VIEW

[Series 1: t lumbar spine ap · 0.14mm/px · 5 of 5 slices shown]
[im 1/5]
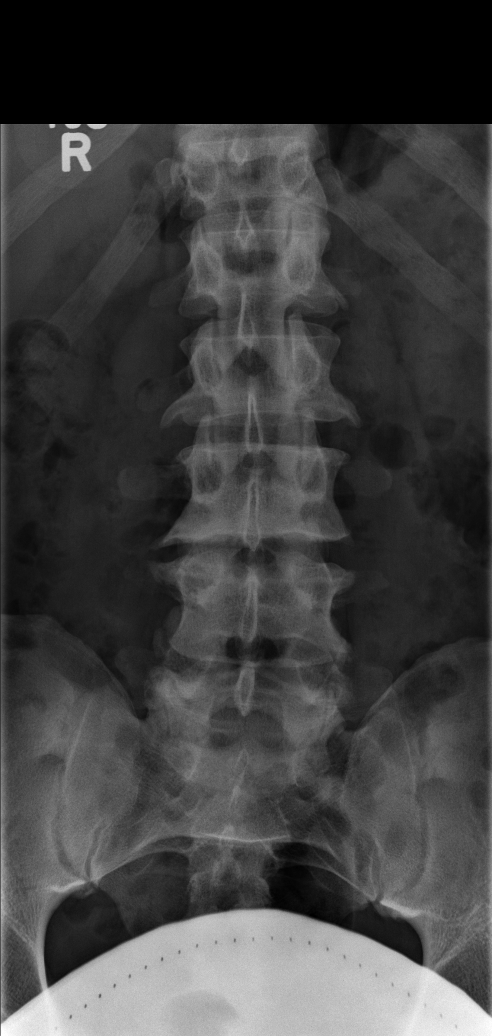
[im 2/5]
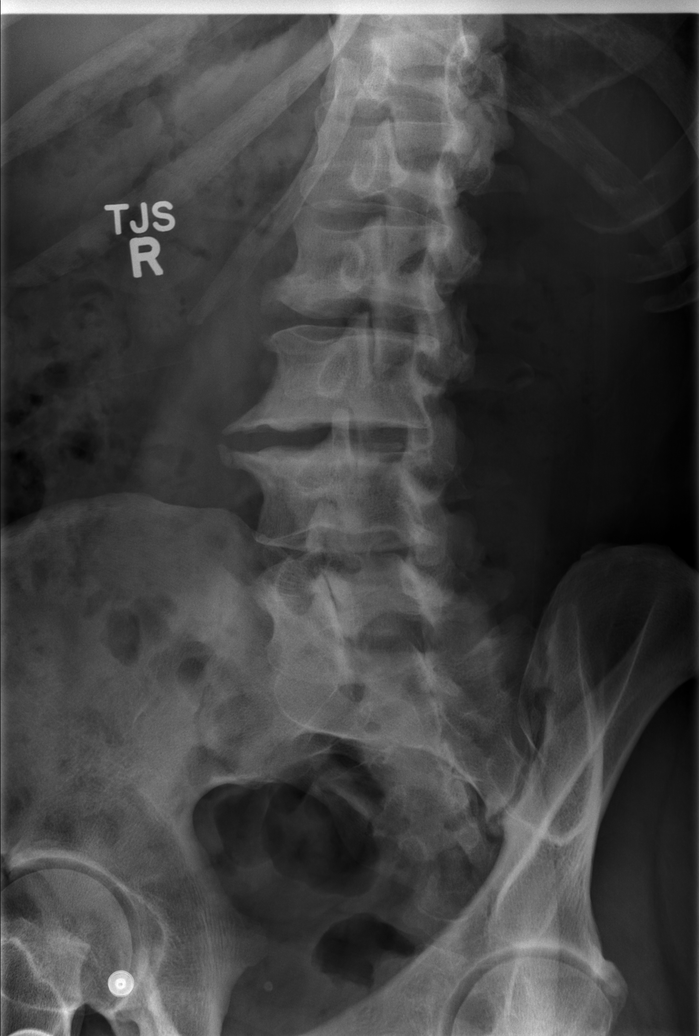
[im 3/5]
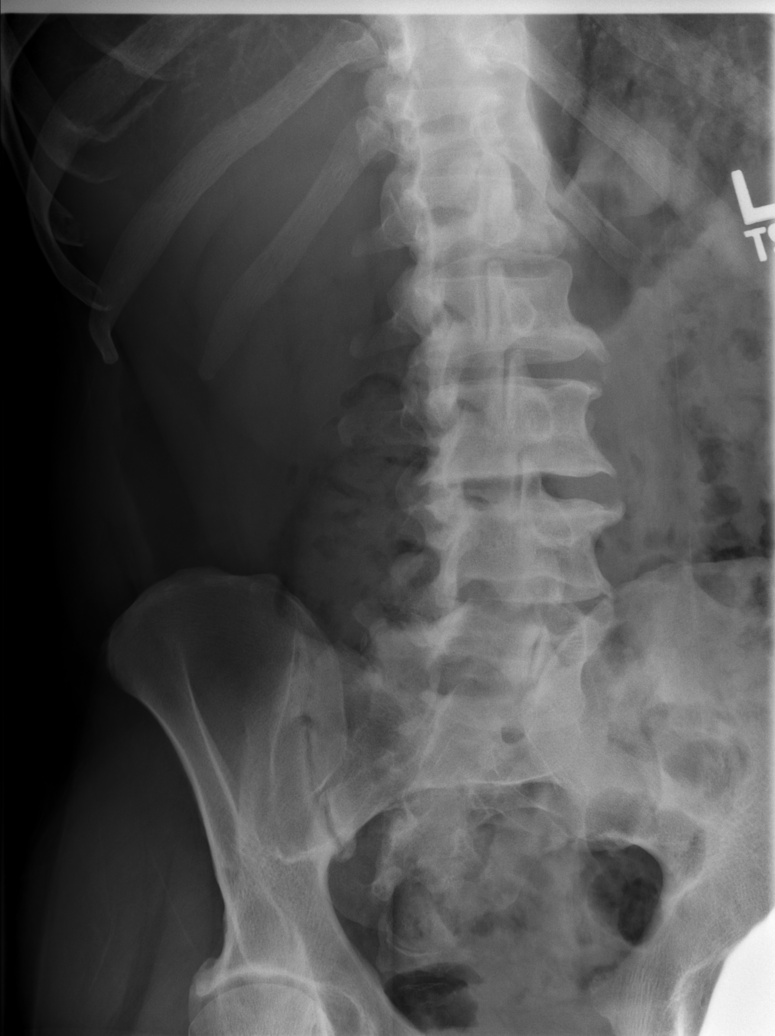
[im 4/5]
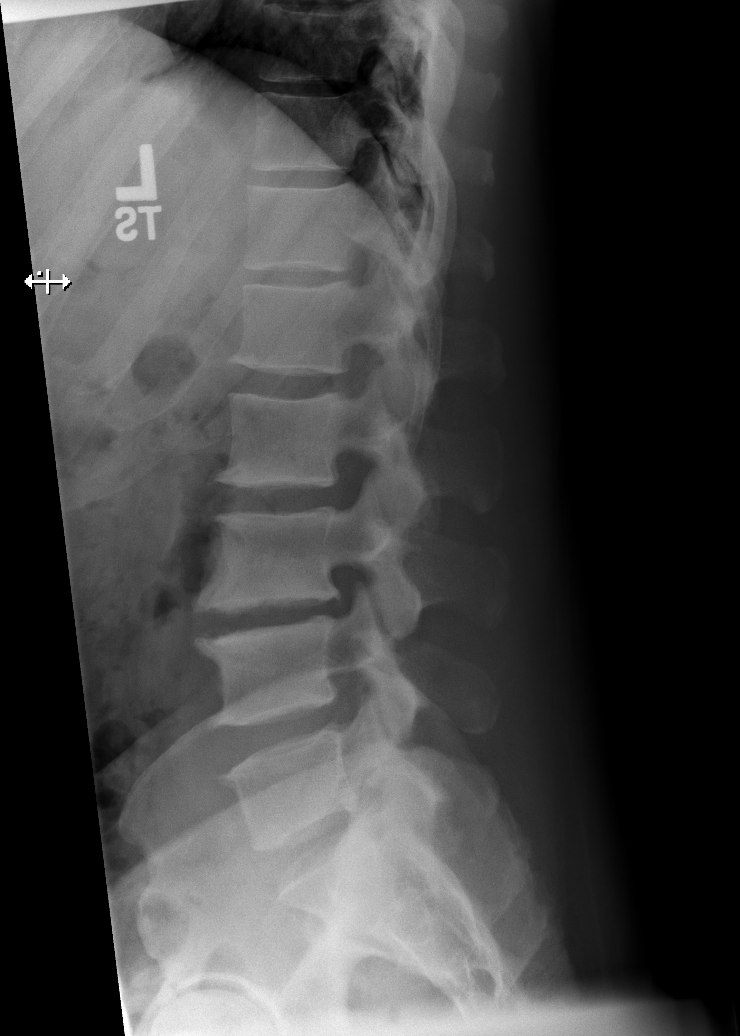
[im 5/5]
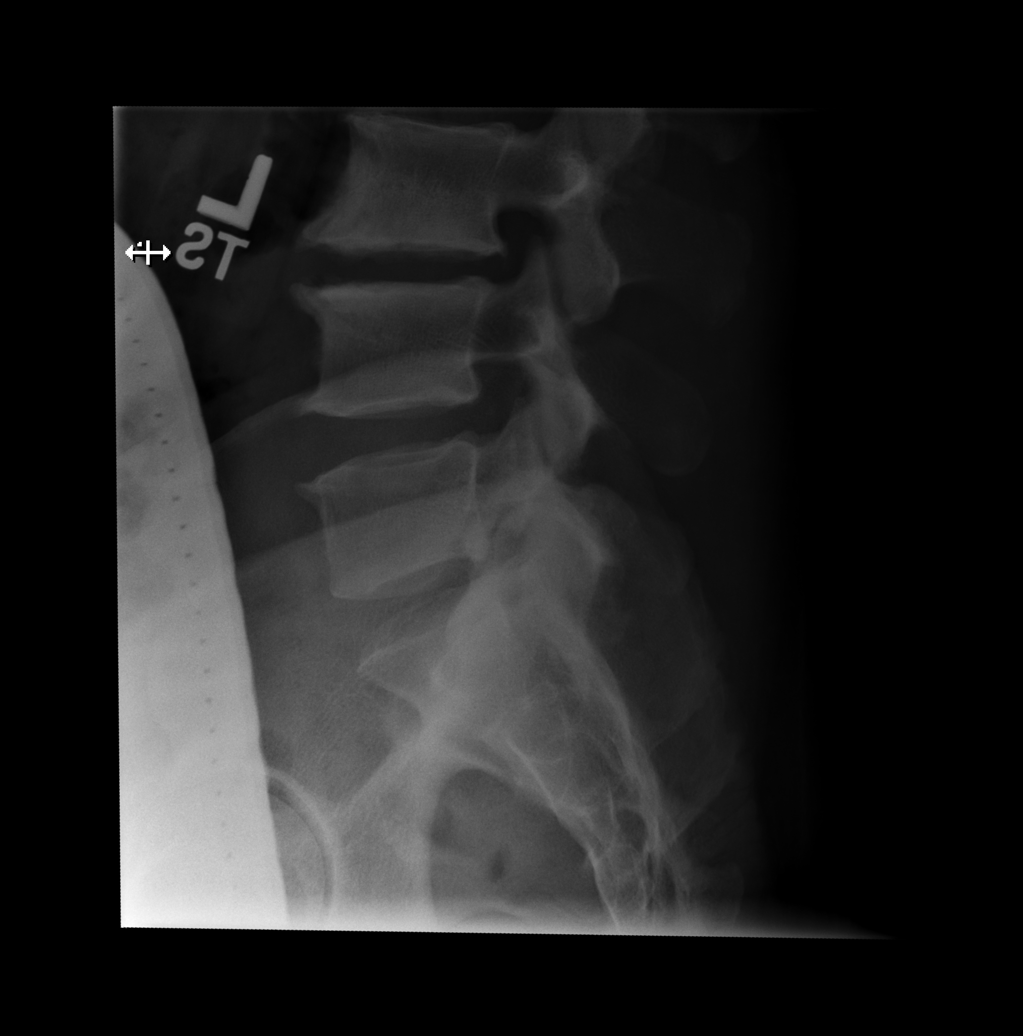

[5 of 5 positions shown; findings below may reference images not displayed]

FINDINGS: There is no evidence of fracture or subluxation. Vertebral bodies
demonstrate normal height and alignment. Anterior and lateral
osteophytes are seen along the lumbar spine. Intervertebral disc
spaces are preserved.

The visualized bowel gas pattern is unremarkable in appearance; air
and stool are noted within the colon. The sacroiliac joints are
within normal limits.
IMPRESSION: No evidence of fracture or subluxation along the lumbar spine.

## 2017-07-30 ENCOUNTER — Emergency Department: Payer: BC Managed Care – PPO

## 2017-07-30 ENCOUNTER — Emergency Department
Admission: EM | Admit: 2017-07-30 | Discharge: 2017-07-30 | Disposition: A | Discharge status: Home / Self Care | Payer: BC Managed Care – PPO | Attending: Emergency Medicine | Admitting: Emergency Medicine

## 2017-07-30 ENCOUNTER — Other Ambulatory Visit: Payer: Self-pay

## 2017-07-30 DIAGNOSIS — M7731 Calcaneal spur, right foot: Secondary | ICD-10-CM | POA: Diagnosis not present

## 2017-07-30 DIAGNOSIS — M7661 Achilles tendinitis, right leg: Secondary | ICD-10-CM | POA: Diagnosis not present

## 2017-07-30 DIAGNOSIS — M25571 Pain in right ankle and joints of right foot: Secondary | ICD-10-CM | POA: Diagnosis present

## 2017-07-30 MED ORDER — TRAMADOL HCL 50 MG PO TABS: 50.0000 mg | ORAL_TABLET | Freq: Two times a day (BID) | ORAL | 0 refills | Status: AC | PRN

## 2017-07-30 MED ORDER — IBUPROFEN 800 MG PO TABS: 800.0000 mg | ORAL_TABLET | Freq: Three times a day (TID) | ORAL | 0 refills | Status: AC | PRN

## 2017-07-30 NOTE — ED Triage Notes (Signed)
Pt comes via POV from home with c/o right ankle pain. Pt states has injured it awhile back and re injured it today. Pt has noticeable swelling to ankle.

## 2017-07-30 NOTE — ED Provider Notes (Signed)
[facility] Emergency Department Provider Note   ____________________________________________   First MD Initiated Contact with Patient 07/30/17 1308     (approximate)  I have reviewed the triage vital signs and the nursing notes.   HISTORY  Chief Complaint Ankle Pain    HPI Justin Ferguson is a 52 y.o. male patient complain of right ankle pain for 1 week.  Pain is secondary to a twisting incident.  Patient state over the past 2 days increased pain and swelling.  Patient denies loss sensation or loss of function.  Patient state pain increases with ambulation.  Patient rates pain as 5/10.  No palliative measures for complaint.  Patient described pain is "achy".  Past Medical History:  Diagnosis Date  . 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 Dental crown present   . Subacromial bursitis of left shoulder joint 01/2017    There are no active problems to display for this patient.   Past Surgical History:  Procedure Laterality Date  . SHOULDER ARTHROSCOPY W/ ROTATOR CUFF REPAIR Right     Prior to Admission medications   Medication Sig Start Date End Date Taking? Authorizing Provider  acetaminophen (TYLENOL) 325 MG tablet Take 2 tablets (650 mg total) by mouth every 6 (six) hours. 02/02/17   Mack Hookhompson, David, MD  ibuprofen (ADVIL,MOTRIN) 800 MG tablet Take 1 tablet (800 mg total) by mouth every 8 (eight) hours as needed for moderate pain. 07/30/17   Joni ReiningSmith, Amry Cathy K, PA-C  meloxicam (MOBIC) 15 MG tablet Take 1 tablet (15 mg total) by mouth daily. 03/05/16   Triplett, Rulon Eisenmengerari B, FNP  oxyCODONE (ROXICODONE) 5 MG immediate release tablet Take 1-2 tablets (5-10 mg total) by mouth every 6 (six) hours as needed for severe pain. 02/02/17   Mack Hookhompson, David, MD  traMADol (ULTRAM) 50 MG tablet Take 1 tablet (50 mg total) by mouth every 12 (twelve) hours as needed. 07/30/17   Joni ReiningSmith, Clemmie Marxen K, PA-C    Allergies Bee venom  No family history on  file.  Social History Social History   Tobacco Use  . Smoking status: Never Smoker  . Smokeless tobacco: Never Used  Substance Use Topics  . Alcohol use: Yes    Comment: 3-4 x/week (beer)  . Drug use: No    Review of Systems Constitutional: No fever/chills Eyes: No visual changes. ENT: No sore throat. Cardiovascular: Denies chest pain. Respiratory: Denies shortness of breath. Gastrointestinal: No abdominal pain.  No nausea, no vomiting.  No diarrhea.  No constipation. Genitourinary: Negative for dysuria. Musculoskeletal: Right ankle pain. Skin: Negative for rash. Neurological: Negative for headaches, focal weakness or numbness. Allergic/Immunilogical: Bee sting ____________________________________________   PHYSICAL EXAM:  VITAL SIGNS: ED Triage Vitals  Enc Vitals Group     BP 07/30/17 1258 (!) 154/95     Pulse Rate 07/30/17 1258 76     Resp 07/30/17 1258 16     Temp 07/30/17 1258 98.4 F (36.9 C)     Temp Source 07/30/17 1258 Oral     SpO2 07/30/17 1258 97 %     Weight 07/30/17 1259 250 lb (113.4 kg)     Height 07/30/17 1259 5\' 11"  (1.803 m)     Head Circumference --      Peak Flow --      Pain Score 07/30/17 1303 5     Pain Loc --      Pain Edu? --      Excl. in GC? --  Constitutional: Alert and oriented. Well appearing and in no acute distress. Cardiovascular: Normal rate, regular rhythm. Grossly normal heart sounds.  Good peripheral circulation.  Elevated blood pressure Respiratory: Normal respiratory effort.  No retractions. Lungs CTAB. Musculoskeletal: No obvious deformity to the right ankle.  Patient has obvious edema.  Patient has full  range of motion.  Moderate guarding palpation plantar aspect the right heel and the posterior aspect the right heel. Neurologic:  Normal speech and language. No gross focal neurologic deficits are appreciated. No gait instability. Skin:  Skin is warm, dry and intact. No rash noted. Psychiatric: Mood and affect are  normal. Speech and behavior are normal.  ____________________________________________   LABS (all labs ordered are listed, but only abnormal results are displayed)  Labs Reviewed - No data to display ____________________________________________  EKG   ____________________________________________  RADIOLOGY  ED MD interpretation:    Official radiology report(s): Dg Ankle Complete Right  Result Date: 07/30/2017 CLINICAL DATA:  Pain following injury.  History of prior injuries. EXAM: RIGHT ANKLE - COMPLETE 3+ VIEW COMPARISON:  None. FINDINGS: Frontal, oblique, and lateral views were obtained. There is soft tissue swelling medially. No evident acute fracture or joint effusion. There is extensive bony overgrowth medially and anteriorly with narrowing in the medial and lateral compartments. There are posterior inferior calcaneal spurs. The ankle mortise appears grossly intact. IMPRESSION: Soft tissue swelling medially. No acute fracture or joint effusion. There is osteoarthritic change throughout the ankle region. There are calcaneal spurs. The ankle mortise appears grossly intact. Electronically Signed   By: Bretta Bang III M.D.   On: 07/30/2017 13:56    ____________________________________________   PROCEDURES  Procedure(s) performed: None  Procedures  Critical Care performed: No  ____________________________________________   INITIAL IMPRESSION / ASSESSMENT AND PLAN / ED COURSE  As part of my medical decision making, I reviewed the following data within the electronic MEDICAL RECORD NUMBER    Right foot and ankle pain secondary to Achilles tendinitis and heel spur.  Discussed x-ray findings with patient.  Patient advised to follow-up with podiatry clinic for definitive evaluation and treatment.  Patient given discharge care instruction advised take medication as directed.      ____________________________________________   FINAL CLINICAL IMPRESSION(S) / ED  DIAGNOSES  Final diagnoses:  Achilles tendinitis of right lower extremity  Calcaneal spur of right foot     ED Discharge Orders        Ordered    ibuprofen (ADVIL,MOTRIN) 800 MG tablet  Every 8 hours PRN     07/30/17 1411    traMADol (ULTRAM) 50 MG tablet  Every 12 hours PRN     07/30/17 1411       Note:  This document was prepared using Dragon voice recognition software and may include unintentional dictation errors.    Joni Reining, PA-C 07/30/17 1415    Sharman Cheek, MD 08/03/17 [phone]

## 2017-07-30 NOTE — Discharge Instructions (Signed)
Follow discharge care instruction.  Follow-up with podiatry clinic for definitive evaluation and treatment.  Take medication as directed.

## 2019-08-10 IMAGING — DX DG ANKLE COMPLETE 3+V*R*
3 series · 3 of 3 positions shown · non-contrast
Comparison: None.

CLINICAL DATA: Pain following injury.  History of prior injuries.

EXAM:
RIGHT ANKLE - COMPLETE 3+ VIEW

[ankle ap]
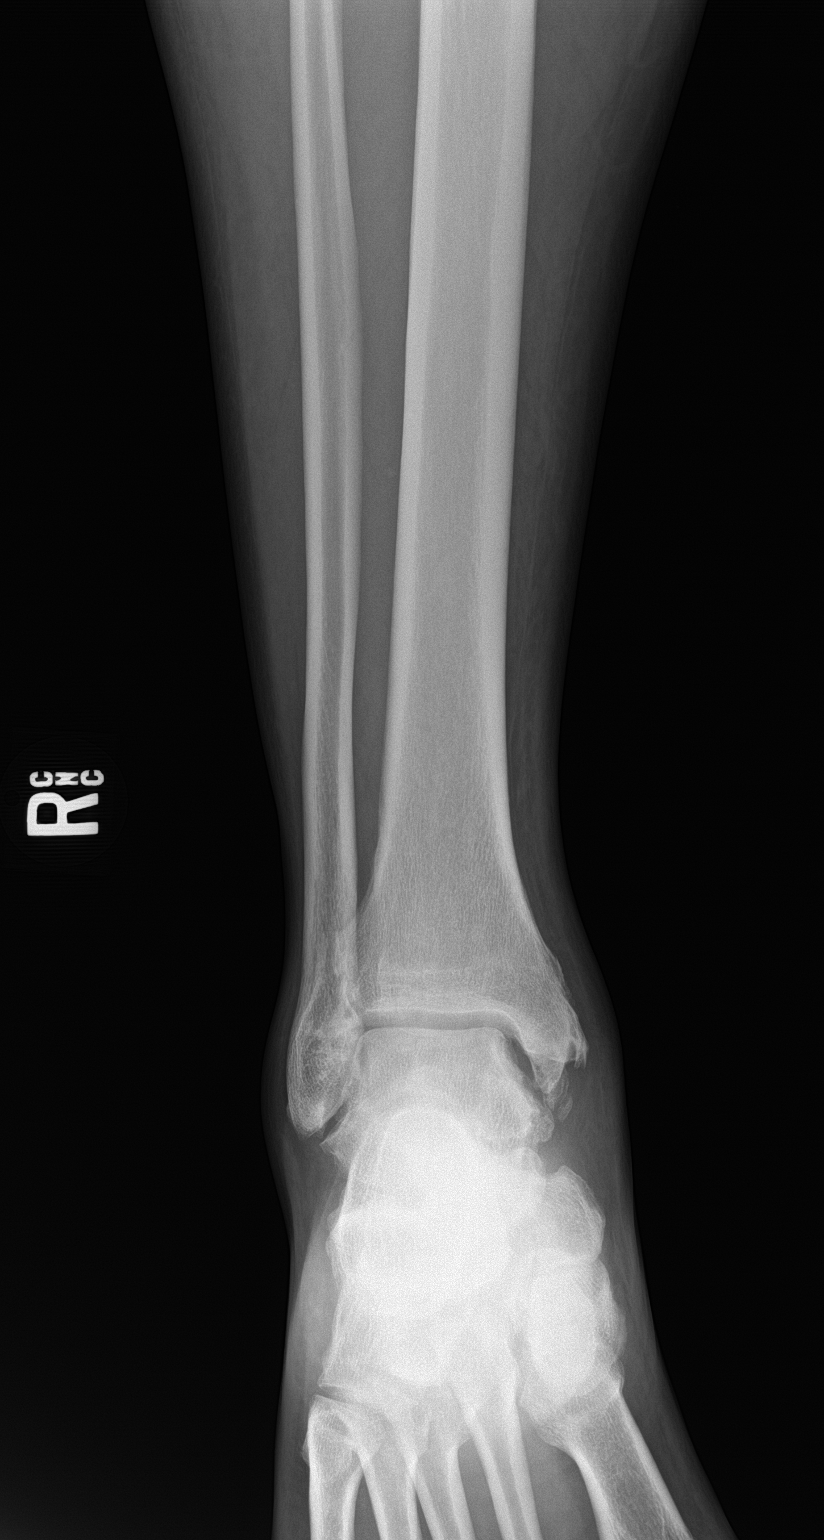

[ankle obl]
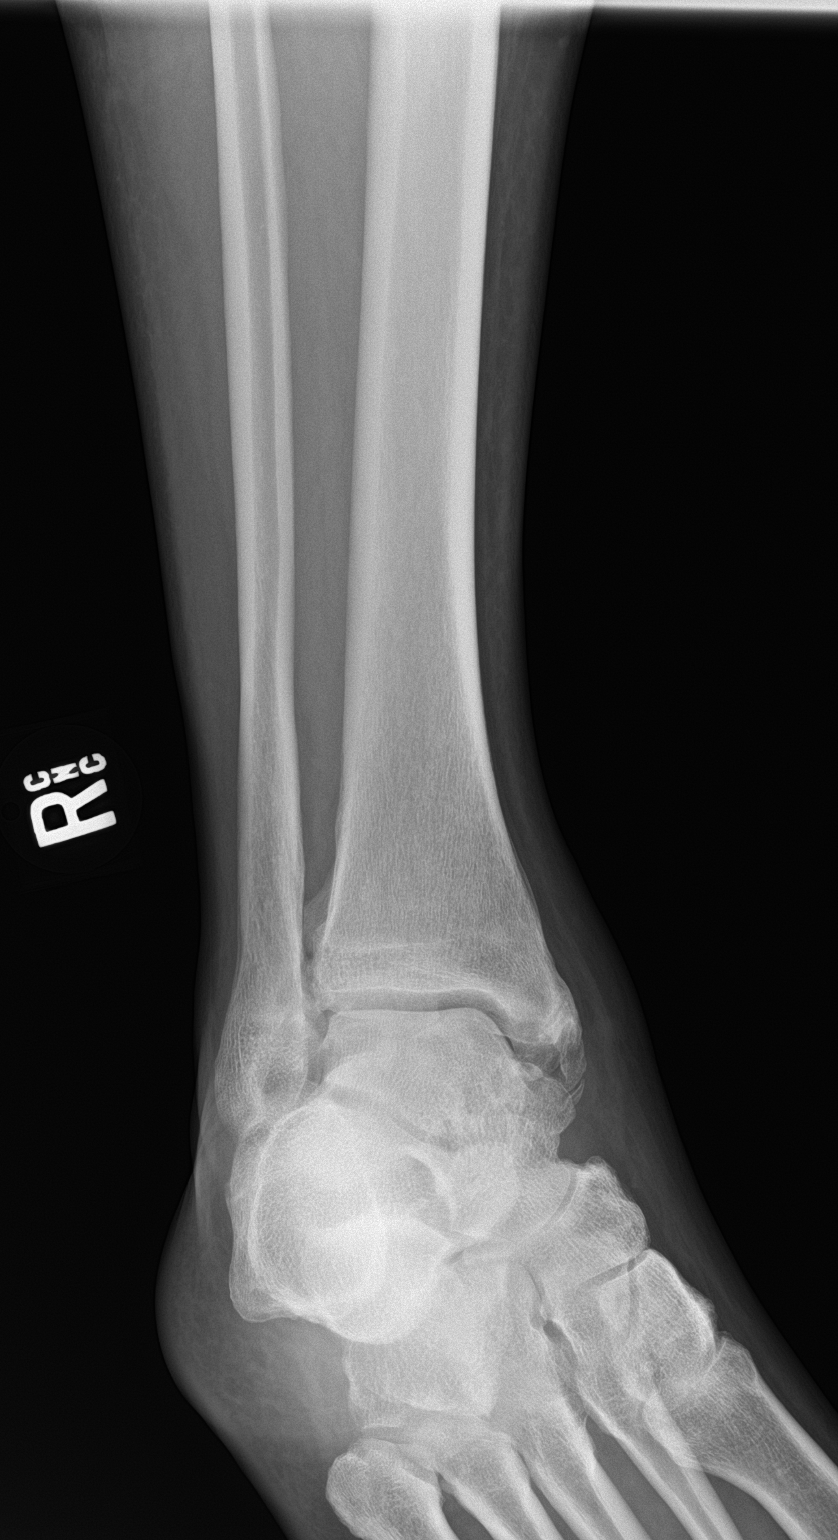

[ankle lat]
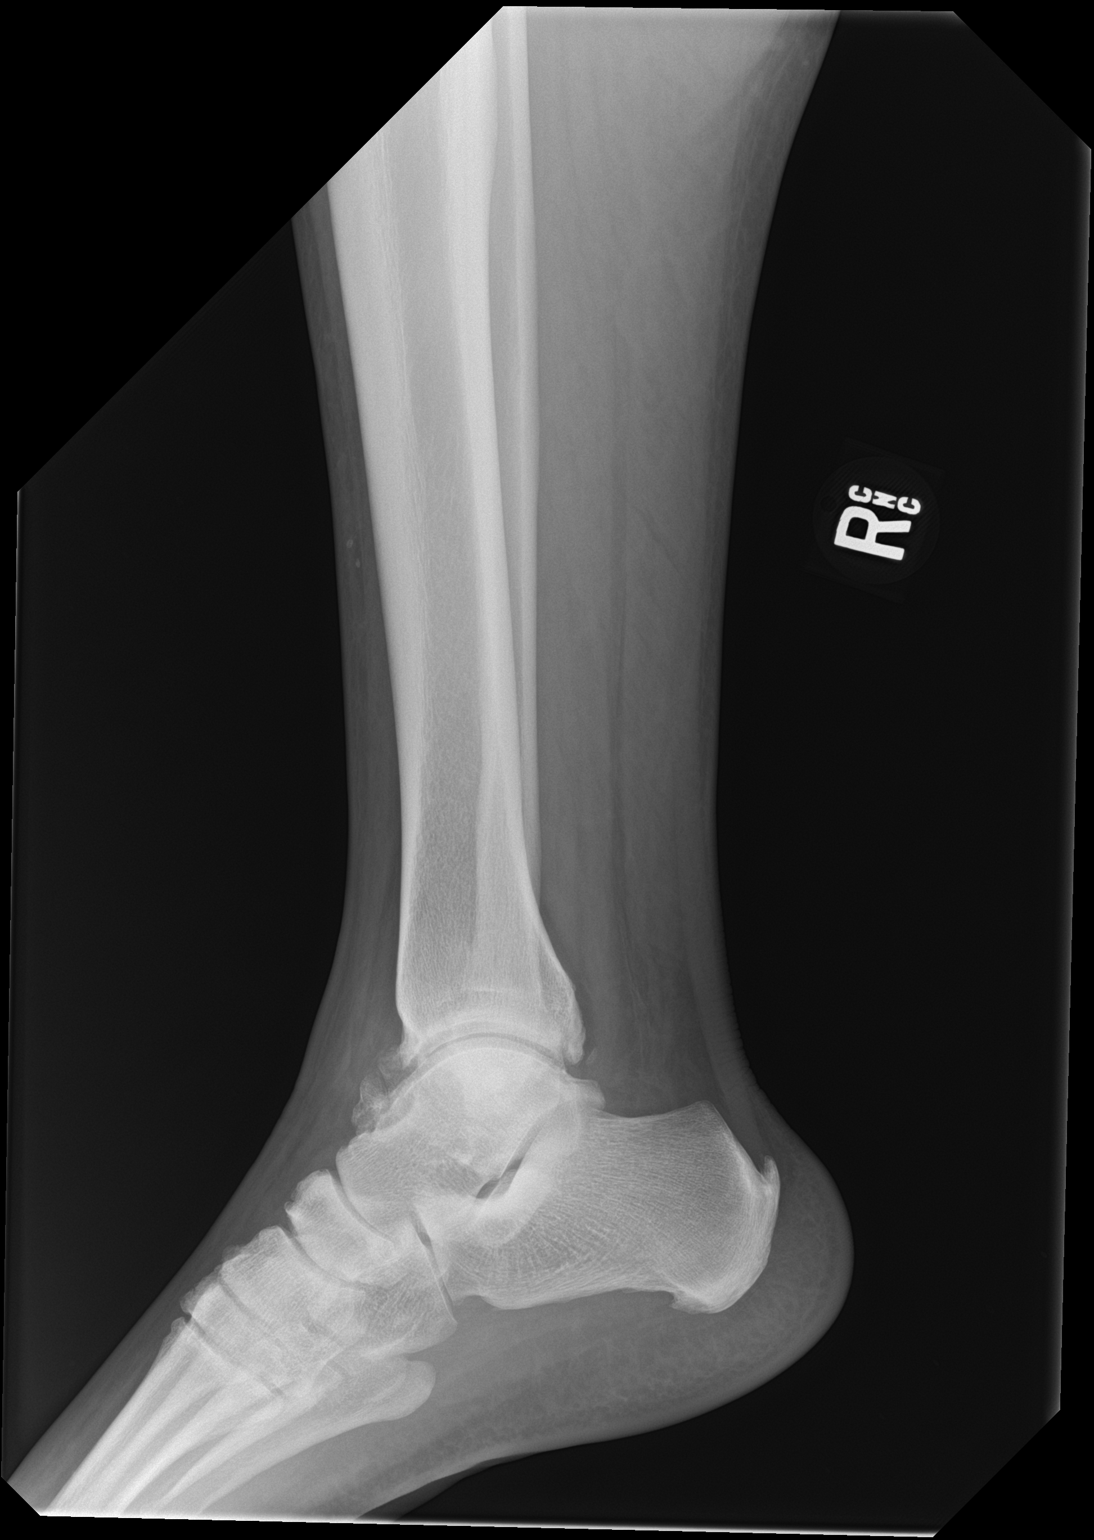

[3 of 3 positions shown; findings below may reference images not displayed]

FINDINGS: Frontal, oblique, and lateral views were obtained. There is soft
tissue swelling medially. No evident acute fracture or joint
effusion. There is extensive bony overgrowth medially and anteriorly
with narrowing in the medial and lateral compartments. There are
posterior inferior calcaneal spurs. The ankle mortise appears
grossly intact.
IMPRESSION: Soft tissue swelling medially. No acute fracture or joint effusion.
There is osteoarthritic change throughout the ankle region. There
are calcaneal spurs. The ankle mortise appears grossly intact.

## 2023-08-28 ENCOUNTER — Other Ambulatory Visit: Payer: Self-pay

## 2023-08-28 ENCOUNTER — Emergency Department (HOSPITAL_COMMUNITY)
Admission: EM | Admit: 2023-08-28 | Discharge: 2023-08-28 | Disposition: A | Discharge status: Home / Self Care | Payer: Self-pay | Attending: Emergency Medicine | Admitting: Emergency Medicine

## 2023-08-28 ENCOUNTER — Emergency Department (HOSPITAL_COMMUNITY): Payer: Self-pay

## 2023-08-28 ENCOUNTER — Encounter (HOSPITAL_COMMUNITY): Payer: Self-pay

## 2023-08-28 DIAGNOSIS — M25522 Pain in left elbow: Secondary | ICD-10-CM | POA: Insufficient documentation

## 2023-08-28 DIAGNOSIS — R030 Elevated blood-pressure reading, without diagnosis of hypertension: Secondary | ICD-10-CM | POA: Diagnosis not present

## 2023-08-28 MED ORDER — KETOROLAC TROMETHAMINE 15 MG/ML IJ SOLN
15.0000 mg | Freq: Once | INTRAMUSCULAR | Status: AC
  Administered 2023-08-28: 15 mg via INTRAMUSCULAR
  Filled 2023-08-28: qty 1

## 2023-08-28 MED ORDER — OXYCODONE HCL 5 MG PO TABS: 5.0000 mg | ORAL_TABLET | ORAL | 0 refills | Status: AC | PRN

## 2023-08-28 NOTE — Discharge Instructions (Addendum)
 Below concerning findings on x-ray.  This could be a soft tissue injury.  I have given you an orthopedic referral.  Please follow-up with them.  You are given a sling.  I have prescribed pain medicine.  Take this in addition of Tylenol  1000 mg every 6 hours and ibuprofen  600 mg every 6 hours.  Pain medicine will make you drowsy so do not drive after taking it. Your blood pressure is elevated.  Follow-up with your primary care doctor to ensure that this improves.  If not they may need to start you on medicine.  If you develop chest pain, shortness of breath, balance issues or vision change please return to the emergency room.  I have given you a referral to internal medicine clinic if you do not have a primary care doctor.

## 2023-08-28 NOTE — ED Triage Notes (Signed)
 Pt came in for left elbow pain. Pt stated he was moving machinery and felt his elbow pop. Pt cannot perform range of motion to it's full extent and there's also swelling at the site.

## 2023-08-28 NOTE — ED Provider Notes (Signed)
 [place] EMERGENCY DEPARTMENT AT [facility] Provider Note   CSN: [id number] Arrival date & time: 08/28/23  1842     Patient presents with: Arm Injury   Justin Ferguson is a 58 y.o. male.   Pt complains of left elbow pain.  Started after he was moving something at work this morning.  Pain has not improved.  Limited range of motion.  The history is provided by the patient. No language interpreter was used.       Prior to Admission medications   Medication Sig Start Date End Date Taking? Authorizing Provider  acetaminophen  (TYLENOL ) 325 MG tablet Take 2 tablets (650 mg total) by mouth every 6 (six) hours. 02/02/17   Sebastian Lenis, MD  ibuprofen  (ADVIL ,MOTRIN ) 800 MG tablet Take 1 tablet (800 mg total) by mouth every 8 (eight) hours as needed for moderate pain. 07/30/17   Claudene Tanda POUR, PA-C  meloxicam  (MOBIC ) 15 MG tablet Take 1 tablet (15 mg total) by mouth daily. 03/05/16   Triplett, Kirk B, FNP  oxyCODONE  (ROXICODONE ) 5 MG immediate release tablet Take 1-2 tablets (5-10 mg total) by mouth every 6 (six) hours as needed for severe pain. 02/02/17   Sebastian Lenis, MD  traMADol  (ULTRAM ) 50 MG tablet Take 1 tablet (50 mg total) by mouth every 12 (twelve) hours as needed. 07/30/17   Claudene Tanda POUR, PA-C    Allergies: Bee venom    Review of Systems  Constitutional:  Negative for chills and fever.  Musculoskeletal:  Positive for arthralgias and joint swelling. Negative for myalgias and neck pain.  All other systems reviewed and are negative.   Updated Vital Signs BP (!) 210/116 (BP Location: Right Arm)   Pulse 85   Temp 98.4 F (36.9 C) (Oral)   Resp 18   Ht 5' 11 (1.803 m)   Wt 113.4 kg   SpO2 94%   BMI 34.87 kg/m   Physical Exam Vitals and nursing note reviewed.  Constitutional:      General: He is not in acute distress.    Appearance: Normal appearance. He is not ill-appearing.  HENT:     Head: Normocephalic and atraumatic.     Nose: Nose normal.   Eyes:     Conjunctiva/sclera: Conjunctivae normal.  Pulmonary:     Effort: Pulmonary effort is normal. No respiratory distress.  Musculoskeletal:        General: No deformity.     Comments: Pain over the left elbow.  Neurovascularly intact.  Left shoulder without tenderness to palpation.  Left wrist without tenderness to palpation.  Mild swelling about the left elbow.  Skin:    Findings: No rash.  Neurological:     Mental Status: He is alert.     (all labs ordered are listed, but only abnormal results are displayed) Labs Reviewed - No data to display  EKG: None  Radiology: DG Elbow Complete Left Result Date: 08/28/2023 CLINICAL DATA:  Left elbow pain. EXAM: LEFT ELBOW - COMPLETE 3+ VIEW COMPARISON:  None Available. FINDINGS: There is no evidence of an acute fracture, dislocation, or joint effusion. A small bony spur is seen along the left olecranon process. Degenerative changes are seen along the left medial humeral epicondyle and left coronoid process. Soft tissues are unremarkable. IMPRESSION: Degenerative changes without evidence of an acute fracture or dislocation. Electronically Signed   By: Suzen Dials M.D.   On: 08/28/2023 19:55     Procedures   Medications Ordered in the ED  ketorolac  (  TORADOL ) 15 MG/ML injection 15 mg (has no administration in time range)                                    Medical Decision Making Amount and/or Complexity of Data Reviewed Radiology: ordered.  Risk Prescription drug management.   57 year old male presents today for left elbow pain.  This occurred at work this morning when he was lifting something heavy.  Has some swelling.  Decreased range of motion.  He is neurovascularly intact. X-ray obtained.  No acute fracture or dislocation noted. Will provide Ortho referral. Sling provided. Pain control discussed. Discharged in stable condition. Patient is in agreement with plan. Patient is right-hand dominant.   Final  diagnoses:  Left elbow pain  Elevated blood pressure reading    ED Discharge Orders          Ordered    oxyCODONE  (ROXICODONE ) 5 MG immediate release tablet  Every 4 hours PRN        08/28/23 2036               Hildegard Loge, PA-C 08/28/23 2038    Francesca Elsie CROME, MD 08/28/23 2249

## 2023-08-28 NOTE — ED Provider Triage Note (Signed)
 Emergency Medicine Provider Triage Evaluation Note  Justin Ferguson , a 58 y.o. male  was evaluated in triage.  Pt complains of left elbow pain.  Started after he was moving something at work this morning.  Pain has not improved.  Limited range of motion.  Review of Systems  Positive: As above Negative: As above  Physical Exam  BP (!) 210/116 (BP Location: Right Arm)   Pulse 85   Temp 98.4 F (36.9 C) (Oral)   Resp 18   Ht 5' 11 (1.803 m)   Wt 113.4 kg   SpO2 94%   BMI 34.87 kg/m  Gen:   Awake, no distress   Resp:  Normal effort  MSK:   Moves extremities without difficulty  Other:    Medical Decision Making  Medically screening exam initiated at 7:06 PM.  Appropriate orders placed.  Justin Ferguson was informed that the remainder of the evaluation will be completed by another provider, this initial triage assessment does not replace that evaluation, and the importance of remaining in the ED until their evaluation is complete.    Hildegard Loge, PA-C 08/28/23 1906

## 2023-09-08 ENCOUNTER — Ambulatory Visit: Admitting: Orthopaedic Surgery

## 2023-11-09 ENCOUNTER — Encounter: Payer: Self-pay | Admitting: Radiology
